# Patient Record
Sex: Male | Born: 1990 | Race: White | Hispanic: No | Marital: Single | Smoking: Current every day smoker
Health system: Southern US, Community
[De-identification: ages and names within clinical notes are randomized; demographics above are authoritative.]

## PROBLEM LIST (undated history)

## (undated) DIAGNOSIS — J45909 Unspecified asthma, uncomplicated: Secondary | ICD-10-CM

## (undated) HISTORY — PX: TYMPANOSTOMY: SHX2586

## (undated) HISTORY — PX: CLEFT PALATE REPAIR: SUR1165

---
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
## 2008-11-21 ENCOUNTER — Emergency Department: Payer: Self-pay | Admitting: Emergency Medicine

## 2009-11-13 ENCOUNTER — Ambulatory Visit: Payer: Self-pay | Admitting: Pediatrics

## 2010-03-11 IMAGING — CR DG FOOT COMPLETE 3+V*L*
1 series · 3 of 3 positions shown · non-contrast
Comparison: none

REASON FOR EXAM: fall/pain
COMMENTS:

PROCEDURE:     DXR - DXR FOOT LT COMP W/OBLIQUES  - November 21, 2008  [DATE]
RESULT:     There does not appear to be evidence of fracture, dislocation or
malalignment.

[Series 1: view not recorded · 0.17mm/px · 3 of 3 slices shown]
[im 1/3]
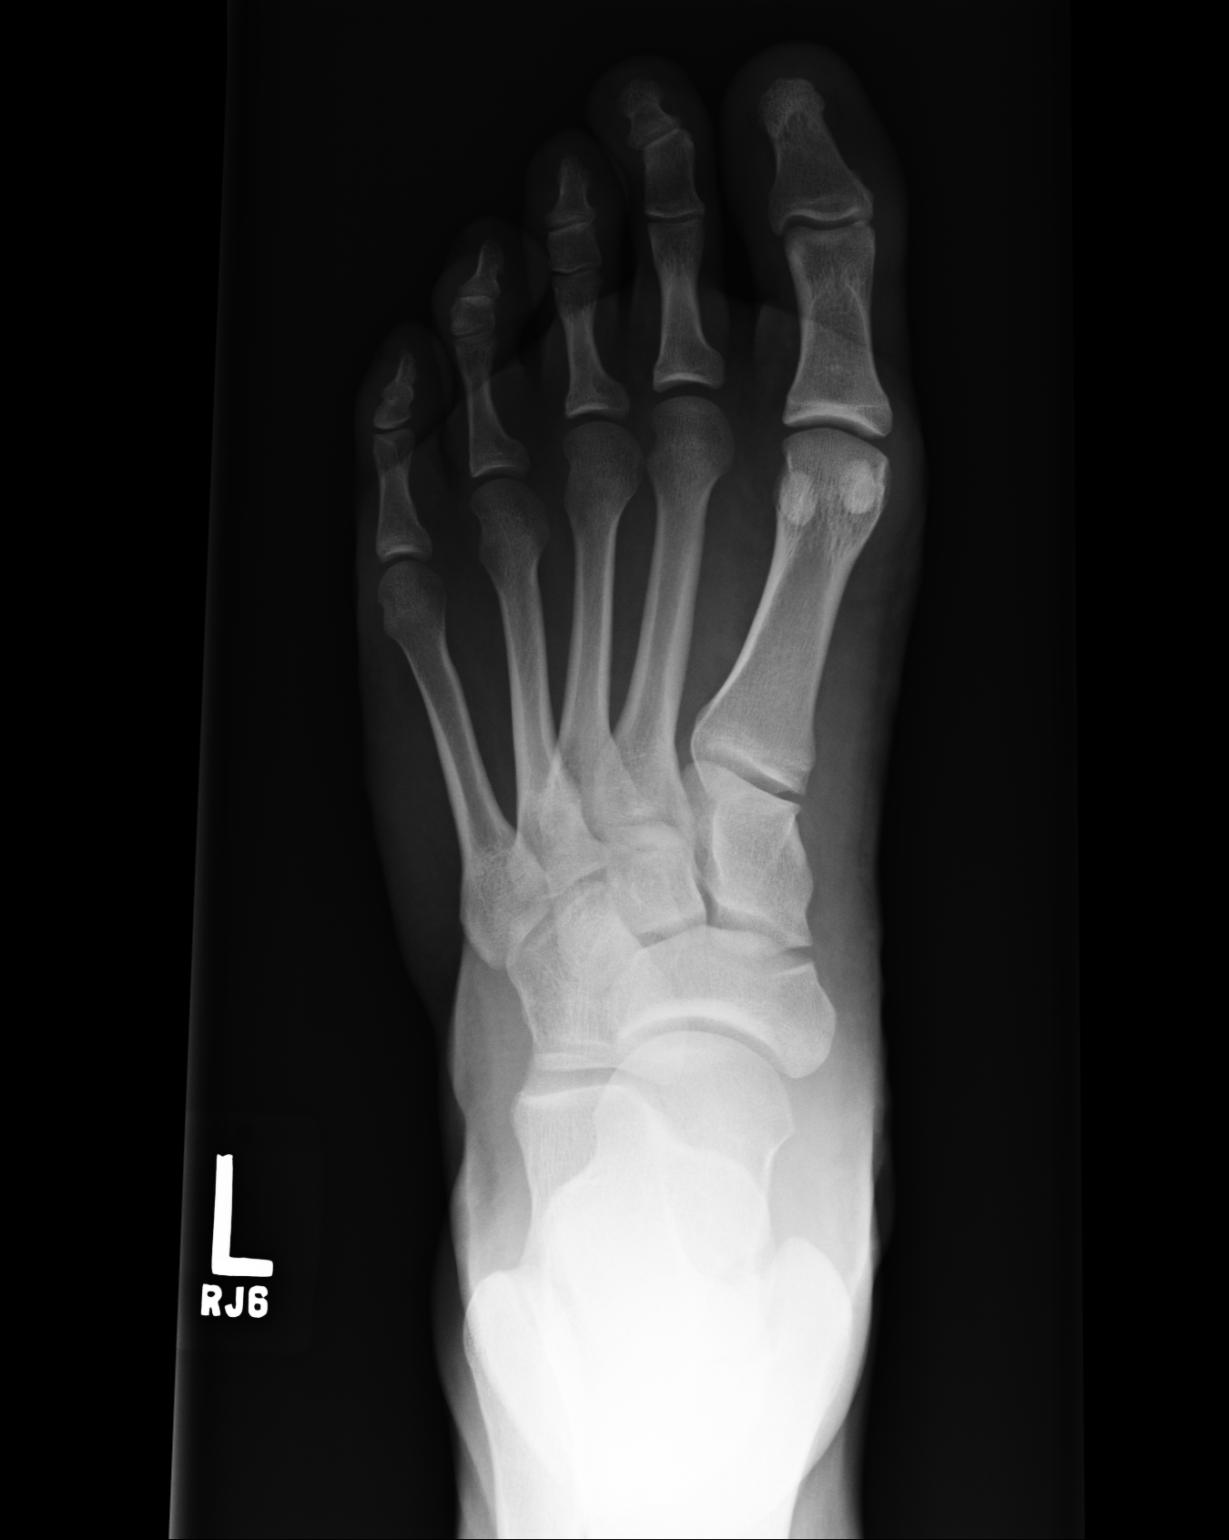
[im 2/3]
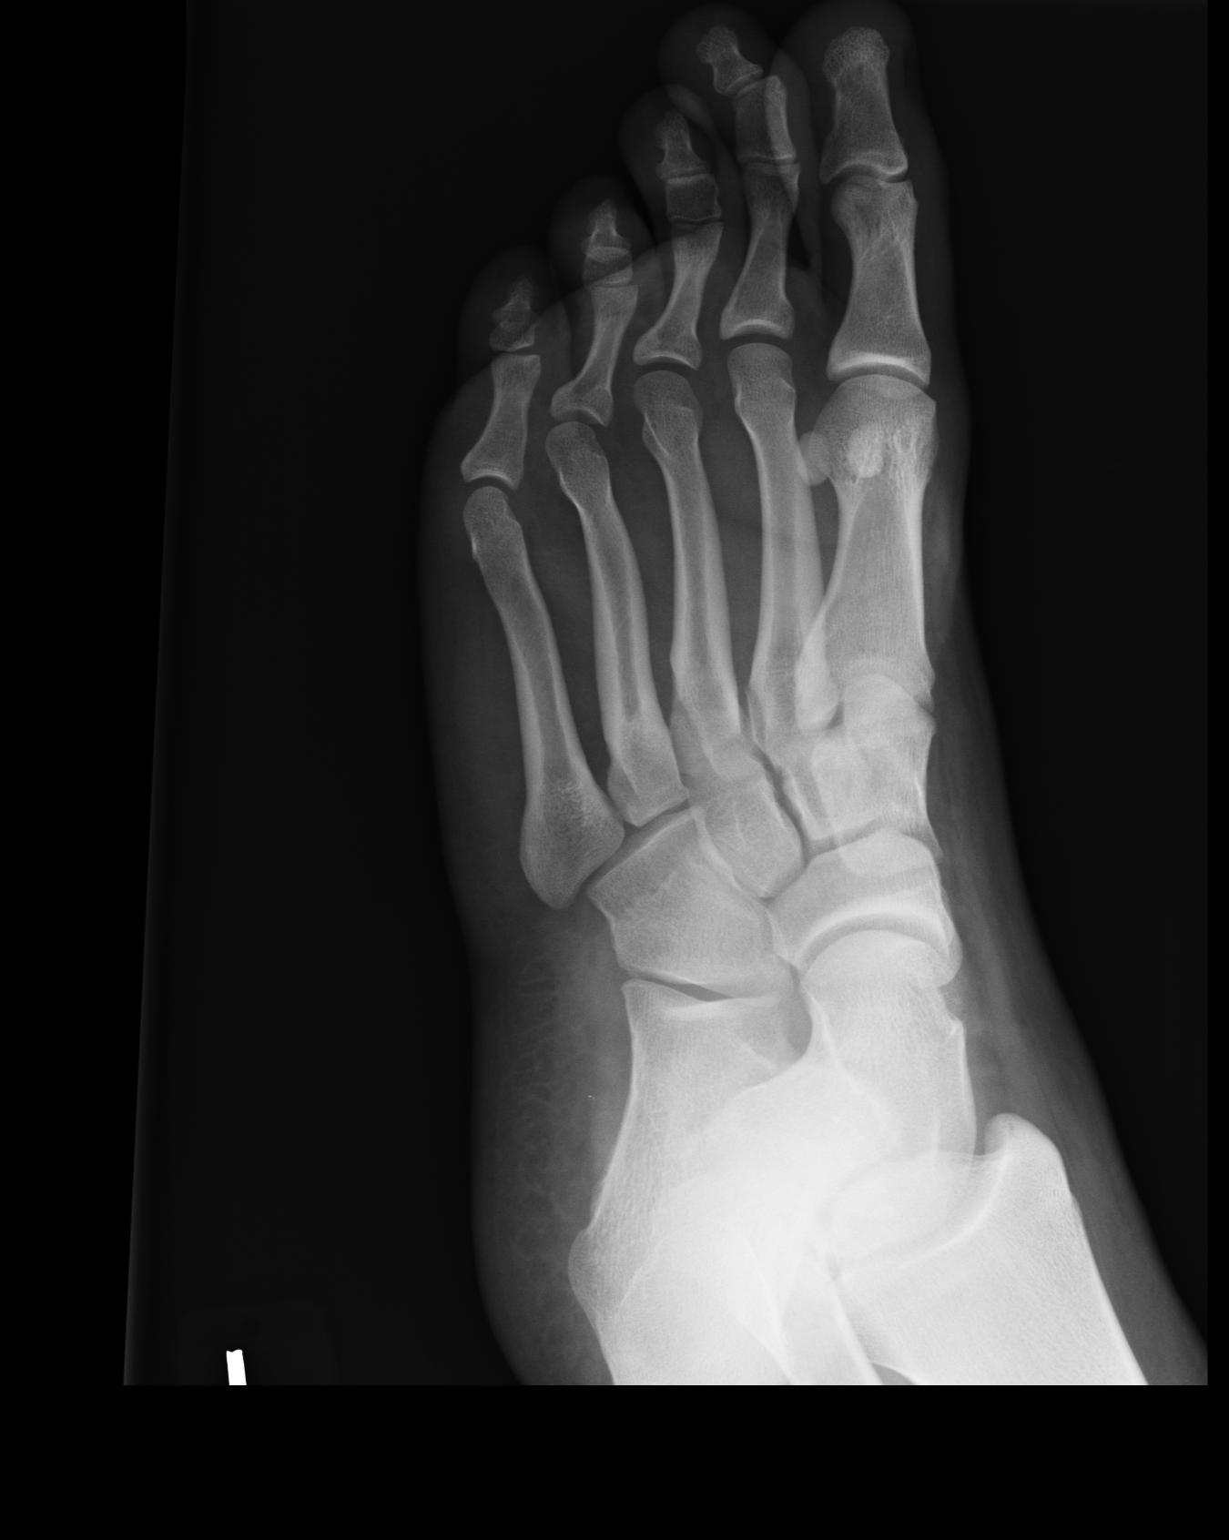
[im 3/3]
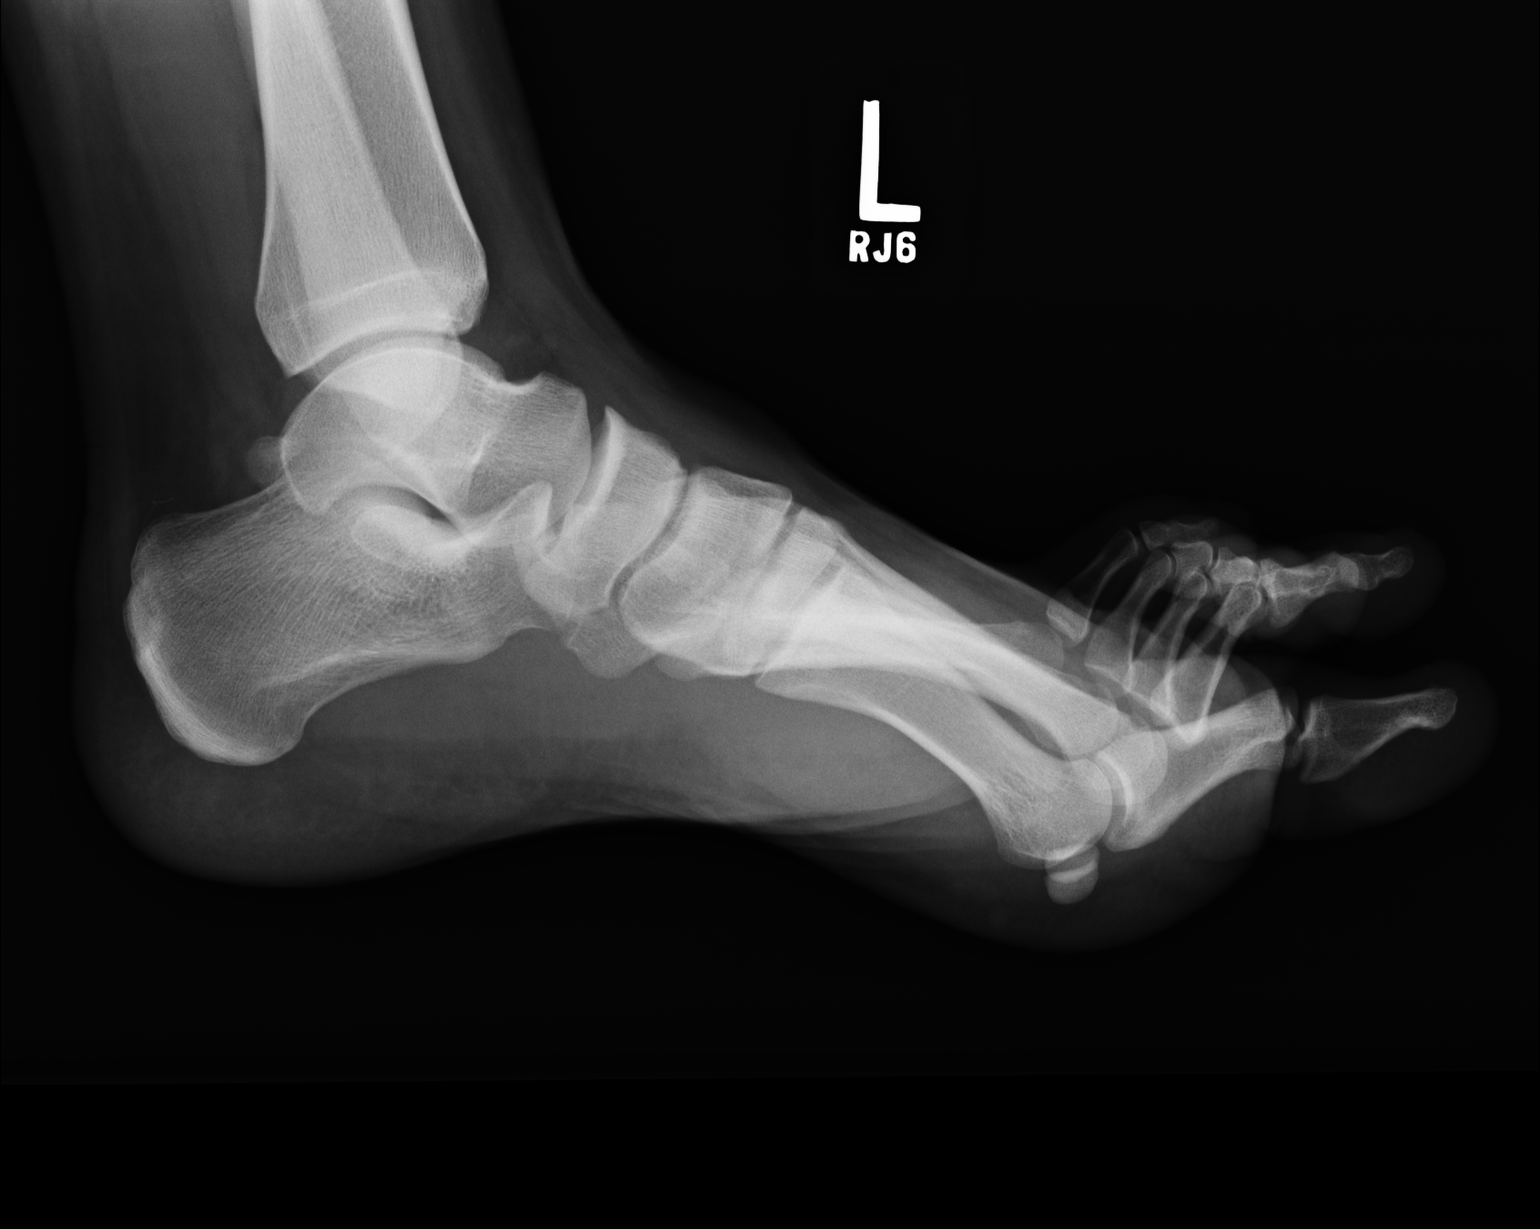

[3 of 3 positions shown; findings below may reference images not displayed]

IMPRESSION: 1.     No acute osseous abnormality.
2.     If there is persistent clinical concern or persistent complaints of
pain, repeat evaluation in 7-10 days is recommended, if clinically
warranted.

## 2011-02-17 ENCOUNTER — Emergency Department: Payer: Self-pay | Admitting: General Practice

## 2013-04-09 ENCOUNTER — Emergency Department: Payer: Self-pay | Admitting: Emergency Medicine

## 2013-10-30 ENCOUNTER — Emergency Department: Payer: Self-pay | Admitting: Emergency Medicine

## 2013-11-07 ENCOUNTER — Emergency Department: Payer: Self-pay | Admitting: Emergency Medicine

## 2018-10-21 ENCOUNTER — Ambulatory Visit: Payer: BLUE CROSS/BLUE SHIELD | Attending: Otolaryngology

## 2018-10-21 DIAGNOSIS — F5101 Primary insomnia: Secondary | ICD-10-CM | POA: Insufficient documentation

## 2018-10-21 DIAGNOSIS — G4733 Obstructive sleep apnea (adult) (pediatric): Secondary | ICD-10-CM | POA: Insufficient documentation

## 2022-09-21 ENCOUNTER — Emergency Department: Payer: BC Managed Care – PPO

## 2022-09-21 ENCOUNTER — Encounter: Payer: Self-pay | Admitting: Emergency Medicine

## 2022-09-21 ENCOUNTER — Other Ambulatory Visit: Payer: Self-pay

## 2022-09-21 ENCOUNTER — Emergency Department
Admission: EM | Admit: 2022-09-21 | Discharge: 2022-09-22 | Disposition: A | Discharge status: Home / Self Care | Payer: BC Managed Care – PPO | Attending: Emergency Medicine | Admitting: Emergency Medicine

## 2022-09-21 DIAGNOSIS — R079 Chest pain, unspecified: Secondary | ICD-10-CM | POA: Diagnosis present

## 2022-09-21 DIAGNOSIS — F17210 Nicotine dependence, cigarettes, uncomplicated: Secondary | ICD-10-CM | POA: Insufficient documentation

## 2022-09-21 DIAGNOSIS — J45909 Unspecified asthma, uncomplicated: Secondary | ICD-10-CM | POA: Insufficient documentation

## 2022-09-21 DIAGNOSIS — J40 Bronchitis, not specified as acute or chronic: Secondary | ICD-10-CM

## 2022-09-21 DIAGNOSIS — J209 Acute bronchitis, unspecified: Secondary | ICD-10-CM | POA: Diagnosis not present

## 2022-09-21 DIAGNOSIS — R0789 Other chest pain: Secondary | ICD-10-CM

## 2022-09-21 HISTORY — DX: Unspecified asthma, uncomplicated: J45.909

## 2022-09-21 LAB — CBC
HCT: 46.3 % (ref 39.0–52.0)
Hemoglobin: 16.1 g/dL (ref 13.0–17.0)
MCH: 31.9 pg (ref 26.0–34.0)
MCHC: 34.8 g/dL (ref 30.0–36.0)
MCV: 91.7 fL (ref 80.0–100.0)
Platelets: 176 10*3/uL (ref 150–400)
RBC: 5.05 MIL/uL (ref 4.22–5.81)
RDW: 12.3 % (ref 11.5–15.5)
WBC: 8.4 10*3/uL (ref 4.0–10.5)
nRBC: 0 % (ref 0.0–0.2)

## 2022-09-21 LAB — BASIC METABOLIC PANEL
Anion gap: 10 (ref 5–15)
BUN: 18 mg/dL (ref 6–20)
CO2: 29 mmol/L (ref 22–32)
Calcium: 9 mg/dL (ref 8.9–10.3)
Chloride: 105 mmol/L (ref 98–111)
Creatinine, Ser: 1.06 mg/dL (ref 0.61–1.24)
GFR, Estimated: >60 mL/min (ref >60)
Glucose, Bld: 137 mg/dL — ABNORMAL HIGH (ref 70–99)
Potassium: 4.2 mmol/L (ref 3.5–5.1)
Sodium: 144 mmol/L (ref 135–145)

## 2022-09-21 LAB — TROPONIN I (HIGH SENSITIVITY): Troponin I (High Sensitivity): 7 ng/L (ref <18)

## 2022-09-21 NOTE — ED Triage Notes (Signed)
Pt to ED via POV with c/o chest tightness and SOB that started this morning. Pt states is a current smoker. Pt states used an old albuterol inhaler without relief. Pt A&O x4, ambulatory without difficulty to triage.

## 2022-09-22 LAB — TROPONIN I (HIGH SENSITIVITY): Troponin I (High Sensitivity): 7 ng/L (ref <18)

## 2022-09-22 MED ORDER — DEXAMETHASONE 10 MG/ML FOR PEDIATRIC ORAL USE
10.0000 mg | Freq: Once | INTRAMUSCULAR | Status: AC
  Administered 2022-09-22: 10 mg via ORAL
  Filled 2022-09-22: qty 1

## 2022-09-22 MED ORDER — ALBUTEROL SULFATE HFA 108 (90 BASE) MCG/ACT IN AERS: INHALATION_SPRAY | RESPIRATORY_TRACT | 0 refills | Status: AC

## 2022-09-22 NOTE — Discharge Instructions (Addendum)
Your workup was reassuring today and it appears that she has some bronchitis.  You likely also are developing some mild COPD or asthma over time, particularly with your smoking history.  We recommend that you use the prescribed albuterol inhaler as needed.  We gave you a one-time dose of a steroid called Decadron which should help with the inflammation associated with the bronchitis.  Please follow-up with your primary care provider and discuss whether you may benefit from pulmonary function tests or referral to a pulmonologist.    Return to the emergency department if you develop new or worsening symptoms that concern you.

## 2022-09-22 NOTE — ED Provider Notes (Signed)
[facility] Provider Note    Event Date/Time   First MD Initiated Contact with Patient 09/21/22 2325     (approximate)   History   Chest Pain   HPI  Lonnie Mason is a 31 y.o. male who presents for evaluation of some tightness in his chest and shortness of breath.  The symptoms started within the last 24 hours.  He states that he has a long-term cigarette smoker and that he was diagnosed with asthma years ago.  He generally is well-controlled and did not even have a albuterol inhaler anymore.  When his symptoms started feels similar to prior episodes of asthma.  He also said that he has many environmental allergies and that seems to be worse when the weather changes dramatically like it has been doing recently.  He said that he was able to "track down" and albuterol inhaler and use it before coming in and that helped him feel a lot better.  Currently he has no symptoms.  He had no pain in his chest, just a feeling of tightness associated with the shortness of breath and wheezing.  He has had a little bit of a headache recently and some nasal congestion.  No nausea or vomiting and no abdominal pain.  No urinary symptoms.     Physical Exam   Triage Vital Signs: ED Triage Vitals  Enc Vitals Group     BP 09/21/22 2207 (!) 187/102     Pulse Rate 09/21/22 2207 (!) 104     Resp 09/21/22 2207 (!) 21     Temp 09/21/22 2207 98.2 F (36.8 C)     Temp Source 09/21/22 2207 Oral     SpO2 09/21/22 2207 94 %     Weight 09/21/22 2204 (!) 138.3 kg (305 lb)     Height 09/21/22 2204 1.772 m (5' 9.75")     Head Circumference --      Peak Flow --      Pain Score 09/21/22 2204 3     Pain Loc --      Pain Edu? --      Excl. in [place]? --     Most recent vital signs: Vitals:   09/21/22 2330 09/22/22 0029  BP: 132/86 (!) 144/88  Pulse: (!) 105 100  Resp: 20 19  Temp: 98 F (36.7 C) 98 F (36.7 C)  SpO2: 91% 94%     General: Awake, no distress.  CV:  Good  peripheral perfusion.  Normal heart sounds.  Borderline tachycardia, regular rhythm. Resp:  Normal effort. Speaking easily and comfortably, no accessory muscle usage nor intercostal retractions.  No wheezing or coarse breath sounds at this time.  Equal breath sounds bilaterally. Abd:  No distention.  Morbid obesity.  No tenderness to palpation of the abdomen. Other:  Patient is conversant, mood and affect are normal, appropriate, and pleasant, patient does not appear ill or toxic.   ED Results / Procedures / Treatments   Labs (all labs ordered are listed, but only abnormal results are displayed) Labs Reviewed  BASIC METABOLIC PANEL - Abnormal; Notable for the following components:      Result Value   Glucose, Bld 137 (*)    All other components within normal limits  CBC  TROPONIN I (HIGH SENSITIVITY)  TROPONIN I (HIGH SENSITIVITY)     EKG  ED ECG REPORT I, Hinda Kehr, the attending physician, personally viewed and interpreted this ECG.  Date: 09/21/2022 EKG Time: 22: 06 Rate:  102 Rhythm: Sinus tachycardia QRS Axis: Right axis deviation Intervals: Incomplete right bundle branch block ST/T Wave abnormalities: Non-specific ST segment / T-wave changes, but no clear evidence of acute ischemia. Narrative Interpretation: no definitive evidence of acute ischemia; does not meet STEMI criteria.    RADIOLOGY I viewed and interpreted the patient's two-view chest x-ray.  There is some thickening of the airways suggestive of viral illness.  Radiology report suggests bronchitis or reactive airway disease.    PROCEDURES:  Critical Care performed: No  Procedures   MEDICATIONS ORDERED IN ED: Medications  dexamethasone (DECADRON) 10 MG/ML injection for Pediatric ORAL use 10 mg (10 mg Oral Given 09/22/22 0021)     IMPRESSION / MDM / ASSESSMENT AND PLAN / ED COURSE  I reviewed the triage vital signs and the nursing notes.                              Differential diagnosis  includes, but is not limited to, asthma exacerbation, COPD, viral illness, bronchitis, pneumonia, less likely ACS or PE.  Patient's presentation is most consistent with acute presentation with potential threat to life or bodily function.  Labs/studies ordered: Two-view chest x-ray, EKG, CBC, high-sensitivity troponin x 2, basic metabolic panel.  Patient is morbidly obese and has a history of asthma and smokes.  He feels better after using his own albuterol.  His workup has been very reassuring and he has signs of bronchitis on chest x-ray.  I gave him a dose of Decadron 10 mg p.o. but I do not think he would benefit from a prescription of prednisone.  I wrote a prescription for an albuterol inhaler and he has a primary care provider with whom he can follow-up.  I gave my usual and customary return precautions.  No indication for hospitalization.      FINAL CLINICAL IMPRESSION(S) / ED DIAGNOSES   Final diagnoses:  Bronchitis  Chest tightness     Rx / DC Orders   ED Discharge Orders          Ordered    albuterol (VENTOLIN HFA) 108 (90 Base) MCG/ACT inhaler       Note to Pharmacy: Pharmacy may substitute brand and size for insurance-approved equivalent   09/22/22 0018             Note:  This document was prepared using Dragon voice recognition software and may include unintentional dictation errors.   Hinda Kehr, MD 09/22/22 [phone]

## 2023-05-25 ENCOUNTER — Emergency Department: Payer: BC Managed Care – PPO

## 2023-05-25 ENCOUNTER — Other Ambulatory Visit: Payer: Self-pay

## 2023-05-25 ENCOUNTER — Emergency Department
Admission: EM | Admit: 2023-05-25 | Discharge: 2023-05-26 | Disposition: A | Discharge status: Home / Self Care | Payer: BC Managed Care – PPO | Attending: Emergency Medicine | Admitting: Emergency Medicine

## 2023-05-25 DIAGNOSIS — N5089 Other specified disorders of the male genital organs: Secondary | ICD-10-CM | POA: Diagnosis present

## 2023-05-25 DIAGNOSIS — N492 Inflammatory disorders of scrotum: Secondary | ICD-10-CM | POA: Diagnosis not present

## 2023-05-25 DIAGNOSIS — J45909 Unspecified asthma, uncomplicated: Secondary | ICD-10-CM | POA: Insufficient documentation

## 2023-05-25 LAB — URINALYSIS, ROUTINE W REFLEX MICROSCOPIC
Bilirubin Urine: NEGATIVE
Glucose, UA: NEGATIVE mg/dL
Hgb urine dipstick: NEGATIVE
Ketones, ur: NEGATIVE mg/dL
Leukocytes,Ua: NEGATIVE
Nitrite: NEGATIVE
Protein, ur: NEGATIVE mg/dL
Specific Gravity, Urine: 1.027 (ref 1.005–1.030)
pH: 5 (ref 5.0–8.0)

## 2023-05-25 NOTE — ED Provider Notes (Signed)
[facility] Provider Note    Event Date/Time   First MD Initiated Contact with Patient 05/25/23 2352     (approximate)   History   Testicle Pain   HPI  Lonnie Mason is a 32 y.o. male with history of pretension, asthma, obesity who presents to the emergency department with complaints of scrotal swelling for the past week.  States he started a new job at [employer] and does a lot of walking and thought that maybe his scrotum was just getting "pinched".  It is not causing him significant pain and there is no redness but he has noticed a hard area to the mid scrotum.  No drainage.  No testicular pain or testicular masses.  No dysuria, penile discharge.  No fever.  He is not a diabetic.   History provided by patient.    Past Medical History:  Diagnosis Date   Asthma     Past Surgical History:  Procedure Laterality Date   CLEFT PALATE REPAIR     TYMPANOSTOMY      MEDICATIONS:  Prior to Admission medications   Medication Sig Start Date End Date Taking? Authorizing Provider  albuterol (VENTOLIN HFA) 108 (90 Base) MCG/ACT inhaler Inhale 2-4 puffs by mouth every 4 hours as needed for wheezing, cough, and/or shortness of breath 09/22/22   Loleta Rose, MD    Physical Exam   Triage Vital Signs: ED Triage Vitals [05/25/23 1935]  Encounter Vitals Group     BP (!) 171/112     Systolic BP Percentile      Diastolic BP Percentile      Pulse Rate (!) 110     Resp 18     Temp 98.4 F (36.9 C)     Temp Source Oral     SpO2 92 %     Weight (!) 305 lb (138.3 kg)     Height 5\' 10"  (1.778 m)     Head Circumference      Peak Flow      Pain Score 3     Pain Loc      Pain Education      Exclude from Growth Chart     Most recent vital signs: Vitals:   05/25/23 1935 05/26/23 0011  BP: (!) 171/112 (!) 168/98  Pulse: (!) 110 100  Resp: 18 16  Temp: 98.4 F (36.9 C) 98.2 F (36.8 C)  SpO2: 92% 100%    CONSTITUTIONAL: Alert, responds appropriately  to questions. Well-appearing; well-nourished, afebrile, nontoxic, in no pain, pleasant HEAD: Normocephalic, atraumatic EYES: Conjunctivae clear, pupils appear equal, sclera nonicteric ENT: normal nose; moist mucous membranes NECK: Supple, normal ROM CARD: RRR; S1 and S2 appreciated RESP: Normal chest excursion without splinting or tachypnea; breath sounds clear and equal bilaterally; no wheezes, no rhonchi, no rales, no hypoxia or respiratory distress, speaking full sentences ABD/GI: Non-distended; soft, non-tender, no rebound, no guarding, no peritoneal signs GU:  Normal external genitalia, circumcised male, normal penile shaft, no blood or discharge at the urethral meatus, no testicular masses or tenderness on exam, patient has some induration to the mid scrotum without redness, fluctuance.  This area is nontender to palpation and there is no crepitus, ecchymosis, gangrene.  No hernias appreciated, 2+ femoral pulses bilaterally; no perineal erythema, warmth, subcutaneous air or crepitus; no high riding testicle. Chaperone present for exam. BACK: The back appears normal EXT: Normal ROM in all joints; no deformity noted, no edema SKIN: Normal color for age and race; warm;  no rash on exposed skin NEURO: Moves all extremities equally, normal speech PSYCH: The patient's mood and manner are appropriate.   ED Results / Procedures / Treatments   LABS: (all labs ordered are listed, but only abnormal results are displayed) Labs Reviewed  URINALYSIS, ROUTINE W REFLEX MICROSCOPIC - Abnormal; Notable for the following components:      Result Value   Color, Urine YELLOW (*)    APPearance CLEAR (*)    All other components within normal limits     EKG:  EKG Interpretation Date/Time:    Ventricular Rate:    PR Interval:    QRS Duration:    QT Interval:    QTC Calculation:   R Axis:      Text Interpretation:           RADIOLOGY: My personal review and interpretation of imaging: Doppler  shows no torsion.  No abscess appreciated.  I have personally reviewed all radiology reports.   US SCROTUM W/DOPPLER  Result Date: 05/25/2023 CLINICAL DATA:  Bilateral testicular pain for 1 week, mostly when walking. EXAM: SCROTAL ULTRASOUND DOPPLER ULTRASOUND OF THE TESTICLES TECHNIQUE: Complete ultrasound examination of the testicles, epididymis, and other scrotal structures was performed. Color and spectral Doppler ultrasound were also utilized to evaluate blood flow to the testicles. COMPARISON:  None Available. FINDINGS: Right testicle Measurements: 4.9 x 2.8 x 3.6 cm. Tiny echogenic foci suggesting microcalcifications scattered throughout the testis. No focal mass lesion identified. Left testicle Measurements: 5.1 x 2.7 x 3.2 cm. Tiny echogenic foci suggesting microcalcifications scattered throughout the testis. No focal mass identified. Right epididymis:  Normal in size and appearance. Left epididymis:  Normal in size and appearance. Hydrocele:  None visualized. Varicocele:  None visualized. Normal homogeneous flow is demonstrated in the testicular parenchyma bilaterally. Pulsed Doppler interrogation of both testes demonstrates normal arterial waveforms bilaterally. A normal venous waveform is demonstrated in the right but the technologist indicates that they were unable to obtained a venous waveform on the left. This could be due to technical factors or could indicate evidence of intermittent torsion. Correlate with physical examination. Consider repeat imaging if symptoms persist. IMPRESSION: 1. Normal color flow Doppler signal demonstrated to both testes and normal arterial waveforms bilaterally. However the technologist was unable to obtain venous waveform on the left. This could be due to technical factors or may indicate evidence of intermittent torsion. Correlate with physical examination and consider repeat imaging. 2. Scattered microcalcifications in both testes. No focal mass lesions. 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 Monthly testicular self-examination and annual physical exams are considered appropriate surveillance. If patient has other risk factors for testicular carcinoma, then referral to Urology should be considered. (Reference: DeCastro, et al.: A 5-Year Follow up Study of Asymptomatic Men with Testicular Microlithiasis. J Urol 2008; 179:1420-1423.) Electronically Signed   By: Burman Nieves M.D.   On: 05/25/2023 23:05     PROCEDURES:  Critical Care performed: No   CRITICAL CARE Performed by: Baxter Hire Katharin Schneider   Total critical care time: 0 minutes  Critical care time was exclusive of separately billable procedures and treating other patients.  Critical care was necessary to treat or prevent imminent or life-threatening deterioration.  Critical care was time spent personally by me on the following activities: development of treatment plan with patient and/or surrogate as well as nursing, discussions  with consultants, evaluation of patient's response to treatment, examination of patient, obtaining history from patient or surrogate, ordering and performing treatments and interventions, ordering and review of laboratory studies, ordering and review of radiographic studies, pulse oximetry and re-evaluation of patient's condition.   Procedures    IMPRESSION / MDM / ASSESSMENT AND PLAN / ED COURSE  I reviewed the triage vital signs and the nursing notes.    Patient here with scrotal swelling.     DIFFERENTIAL DIAGNOSIS (includes but not limited to):   Scrotal swelling possibly secondary to repeated trauma, cellulitis, no obvious signs of abscess, doubt torsion given no testicular pain   Patient's presentation is most consistent with acute presentation with potential threat to  life or bodily function.   PLAN: Urine shows no sign of infection.  Scrotal Doppler reviewed and interpreted by myself and the radiologist and shows no torsion.  Clinically he has no testicular pain or testicular masses.  His scrotum is slightly indurated which could be from repeated trauma from walking and stating that he feels like his scrotum is getting "pinched" regularly but cannot rule out cellulitis.  There is no sign of abscess clinically on exam or on ultrasound.  Low suspicion for Fournier's gangrene.  He has no crepitus.  No systemic symptoms.  He is not a diabetic.  Will start him on clindamycin for both strep and staph coverage and give probiotics.  Recommended follow-up with urology if symptoms not improving.  Discussed at length strict return precautions.  He is comfortable with this plan.   MEDICATIONS GIVEN IN ED: Medications  clindamycin (CLEOCIN) capsule 450 mg (450 mg Oral Given 05/26/23 0013)     ED COURSE:  At this time, I do not feel there is any life-threatening condition present. I reviewed all nursing notes, vitals, pertinent previous records.  All lab and urine results, EKGs, imaging ordered have been independently reviewed and interpreted by myself.  I reviewed all available radiology reports from any imaging ordered this visit.  Based on my assessment, I feel the patient is safe to be discharged home without further emergent workup and can continue workup as an outpatient as needed. Discussed all findings, treatment plan as well as usual and customary return precautions.  They verbalize understanding and are comfortable with this plan.  Outpatient follow-up has been provided as needed.  All questions have been answered.    CONSULTS:  none   OUTSIDE RECORDS REVIEWED: Reviewed last family medicine note on 08/07/2022.       FINAL CLINICAL IMPRESSION(S) / ED DIAGNOSES   Final diagnoses:  Cellulitis of scrotum     Rx / DC Orders   ED Discharge Orders           Ordered    clindamycin (CLEOCIN) 150 MG capsule  3 times daily        05/26/23 0008    Bacillus Coagulans-Inulin (PROBIOTIC) 1-250 BILLION-MG CAPS  Daily        05/26/23 0008             Note:  This document was prepared using Dragon voice recognition software and may include unintentional dictation errors.   Bobbette Eakes, Layla Maw, DO 05/26/23 0020

## 2023-05-25 NOTE — ED Triage Notes (Signed)
Pt presents to ER with c/o pain to his testicles.  Pt states around one week ago, he started to notice a lump in his scrotum.  Pt states this lump is tender to touch and is about the size of a quarter.  Pt states pain is worsened upon palpation.  Pt denies urinary symptoms and is not concerned about possible STDs.  Pt otherwise A&O x4 and in NAD at this time.

## 2023-05-25 NOTE — ED Notes (Signed)
No answer when called several times from lobby 

## 2023-05-26 MED ORDER — CLINDAMYCIN HCL 150 MG PO CAPS: 450.0000 mg | ORAL_CAPSULE | Freq: Three times a day (TID) | ORAL | 0 refills | Status: AC

## 2023-05-26 MED ORDER — PROBIOTIC 1-250 BILLION-MG PO CAPS: 1.0000 | ORAL_CAPSULE | Freq: Every day | ORAL | 0 refills | Status: AC

## 2023-05-26 MED ORDER — SULFAMETHOXAZOLE-TRIMETHOPRIM 800-160 MG PO TABS: 1.0000 | ORAL_TABLET | Freq: Once | ORAL | Status: DC

## 2023-05-26 MED ORDER — CLINDAMYCIN HCL 150 MG PO CAPS
450.0000 mg | ORAL_CAPSULE | Freq: Once | ORAL | Status: AC
  Administered 2023-05-26: 450 mg via ORAL
  Filled 2023-05-26: qty 3

## 2023-05-26 NOTE — Discharge Instructions (Signed)
You may alternate Tylenol 1000 mg every 6 hours as needed for pain, fever and Ibuprofen 800 mg every 6-8 hours as needed for pain, fever.  Please take Ibuprofen with food.  Do not take more than 4000 mg of Tylenol (acetaminophen) in a 24 hour period. ° °

## 2023-08-23 ENCOUNTER — Emergency Department: Payer: BC Managed Care – PPO

## 2023-08-23 ENCOUNTER — Other Ambulatory Visit: Payer: Self-pay

## 2023-08-23 DIAGNOSIS — J45901 Unspecified asthma with (acute) exacerbation: Secondary | ICD-10-CM | POA: Diagnosis not present

## 2023-08-23 DIAGNOSIS — R0602 Shortness of breath: Secondary | ICD-10-CM | POA: Diagnosis present

## 2023-08-23 DIAGNOSIS — F17210 Nicotine dependence, cigarettes, uncomplicated: Secondary | ICD-10-CM | POA: Diagnosis not present

## 2023-08-23 MED ORDER — METHYLPREDNISOLONE SODIUM SUCC 125 MG IJ SOLR
125.0000 mg | Freq: Once | INTRAMUSCULAR | Status: AC
  Administered 2023-08-23: 125 mg via INTRAVENOUS
  Filled 2023-08-23: qty 2

## 2023-08-23 MED ORDER — IPRATROPIUM-ALBUTEROL 0.5-2.5 (3) MG/3ML IN SOLN
3.0000 mL | Freq: Once | RESPIRATORY_TRACT | Status: AC
  Administered 2023-08-23: 3 mL via RESPIRATORY_TRACT
  Filled 2023-08-23: qty 3

## 2023-08-23 NOTE — ED Notes (Signed)
Mckenzie, RN made aware ot pt O2 sat of 92% on RA. Pt placed on 2L O2 via [place] at this time.

## 2023-08-23 NOTE — ED Triage Notes (Signed)
Upper wheeze auscultated over pt throat. Lung sounds clear otherwise. Pt speaking in full sentences.

## 2023-08-23 NOTE — ED Triage Notes (Signed)
BIB [facility] from home. Called out asthma exacerbation. Out of meds at home. Pt alert and oriented on arrival. Pt DOE but unlabored and rest and with symmetric chest rise and fall.  96% RA on EMS arrival. 99% with nebs going. 1 albuterol and 1 duo 190/120 hasn't taken bp meds since last night.

## 2023-08-23 NOTE — ED Provider Notes (Signed)
Emergency department triage note  Patient is a 32 year old male with a stated past medical history of asthma presents via EMS with complaints of shortness of breath.  Patient states that shortness of breath progressed over the last hour and was refractory to home albuterol treatments.  Patient was initially in mild respiratory distress with an expiratory wheezing over bilateral lung fields per EMS personnel the patient was given a DuoNeb en route with improvement in patient's symptoms as well as physical exam per EMS personnel.   Brief physical exam:  General: Middle-aged obese Caucasian male sitting in stretcher resting comfortably with nonrebreather in place and nebulizer treatment going Cardiopulmonary: No cyanosis.  Normal respiratory effort.  Clear to auscultation bilaterally  I have ordered customary labs and imaging including CBC, CMP, troponin, EKG, and chest x-ray as well as empiric treatments for asthma exacerbation such as DuoNeb, Solu-Medrol, and continued monitoring.   Merwyn Katos, MD 08/23/23 [phone]

## 2023-08-24 ENCOUNTER — Emergency Department
Admission: EM | Admit: 2023-08-24 | Discharge: 2023-08-24 | Disposition: A | Discharge status: Home / Self Care | Payer: BC Managed Care – PPO | Attending: Emergency Medicine | Admitting: Emergency Medicine

## 2023-08-24 DIAGNOSIS — J45901 Unspecified asthma with (acute) exacerbation: Secondary | ICD-10-CM

## 2023-08-24 LAB — CBC WITH DIFFERENTIAL/PLATELET
Abs Immature Granulocytes: 0.05 10*3/uL (ref 0.00–0.07)
Basophils Absolute: 0.1 10*3/uL (ref 0.0–0.1)
Basophils Relative: 1 %
Eosinophils Absolute: 0.3 10*3/uL (ref 0.0–0.5)
Eosinophils Relative: 4 %
HCT: 44.4 % (ref 39.0–52.0)
Hemoglobin: 15.9 g/dL (ref 13.0–17.0)
Immature Granulocytes: 1 %
Lymphocytes Relative: 30 %
Lymphs Abs: 2.9 10*3/uL (ref 0.7–4.0)
MCH: 31.9 pg (ref 26.0–34.0)
MCHC: 35.8 g/dL (ref 30.0–36.0)
MCV: 89.2 fL (ref 80.0–100.0)
Monocytes Absolute: 0.6 10*3/uL (ref 0.1–1.0)
Monocytes Relative: 6 %
Neutro Abs: 5.8 10*3/uL (ref 1.7–7.7)
Neutrophils Relative %: 58 %
Platelets: 186 10*3/uL (ref 150–400)
RBC: 4.98 MIL/uL (ref 4.22–5.81)
RDW: 11.9 % (ref 11.5–15.5)
WBC: 9.7 10*3/uL (ref 4.0–10.5)
nRBC: 0 % (ref 0.0–0.2)

## 2023-08-24 LAB — TROPONIN I (HIGH SENSITIVITY)
Troponin I (High Sensitivity): 8 ng/L (ref <18)
Troponin I (High Sensitivity): 8 ng/L (ref <18)

## 2023-08-24 LAB — COMPREHENSIVE METABOLIC PANEL
ALT: 45 U/L — ABNORMAL HIGH (ref 0–44)
AST: 33 U/L (ref 15–41)
Albumin: 4 g/dL (ref 3.5–5.0)
Alkaline Phosphatase: 73 U/L (ref 38–126)
Anion gap: 10 (ref 5–15)
BUN: 12 mg/dL (ref 6–20)
CO2: 30 mmol/L (ref 22–32)
Calcium: 9 mg/dL (ref 8.9–10.3)
Chloride: 100 mmol/L (ref 98–111)
Creatinine, Ser: 0.99 mg/dL (ref 0.61–1.24)
GFR, Estimated: >60 mL/min (ref >60)
Glucose, Bld: 147 mg/dL — ABNORMAL HIGH (ref 70–99)
Potassium: 3.7 mmol/L (ref 3.5–5.1)
Sodium: 140 mmol/L (ref 135–145)
Total Bilirubin: 0.6 mg/dL (ref <1.2)
Total Protein: 7 g/dL (ref 6.5–8.1)

## 2023-08-24 MED ORDER — IPRATROPIUM-ALBUTEROL 0.5-2.5 (3) MG/3ML IN SOLN
3.0000 mL | Freq: Once | RESPIRATORY_TRACT | Status: AC
  Administered 2023-08-24: 3 mL via RESPIRATORY_TRACT
  Filled 2023-08-24: qty 3

## 2023-08-24 MED ORDER — METHYLPREDNISOLONE SODIUM SUCC 125 MG IJ SOLR
125.0000 mg | Freq: Once | INTRAMUSCULAR | Status: AC
  Administered 2023-08-24: 125 mg via INTRAVENOUS
  Filled 2023-08-24: qty 2

## 2023-08-24 MED ORDER — ALBUTEROL SULFATE HFA 108 (90 BASE) MCG/ACT IN AERS: 2.0000 | INHALATION_SPRAY | Freq: Four times a day (QID) | RESPIRATORY_TRACT | 2 refills | Status: AC | PRN

## 2023-08-24 MED ORDER — PREDNISONE 50 MG PO TABS: 50.0000 mg | ORAL_TABLET | Freq: Every day | ORAL | 0 refills | Status: AC

## 2023-08-24 MED ORDER — ALBUTEROL SULFATE (2.5 MG/3ML) 0.083% IN NEBU
2.5000 mg | INHALATION_SOLUTION | Freq: Once | RESPIRATORY_TRACT | Status: AC
  Administered 2023-08-24: 2.5 mg via RESPIRATORY_TRACT
  Filled 2023-08-24: qty 3

## 2023-08-24 NOTE — ED Notes (Signed)
Ambulatory opulse ox is 90-92 on RA.  His pulse went to 118 during walking.

## 2023-08-24 NOTE — ED Notes (Signed)
Pulse ox is maintaining at around 90-91% RA after neb.  His pulse is is now 107.  No distress.

## 2023-08-24 NOTE — ED Provider Notes (Signed)
[facility] Provider Note    Event Date/Time   First MD Initiated Contact with Patient 08/24/23 [phone]     (approximate)   History   Shortness of Breath   HPI  Lonnie Mason is a 32 y.o. male brought in by EMS with a history of asthma presents with complaints of shortness of breath, wheezing, he does smoke cigarettes.     Physical Exam   Triage Vital Signs: ED Triage Vitals  Encounter Vitals Group     BP 08/23/23 2332 (!) 153/88     Systolic BP Percentile --      Diastolic BP Percentile --      Pulse Rate 08/23/23 2332 (!) 104     Resp 08/23/23 2332 (!) 22     Temp 08/23/23 2332 98.2 F (36.8 C)     Temp Source 08/23/23 2332 Oral     SpO2 08/23/23 2332 97 %     Weight 08/23/23 2320 (!) 140.6 kg (310 lb)     Height 08/23/23 2320 1.753 m (5\' 9" )     Head Circumference --      Peak Flow --      Pain Score 08/23/23 2320 0     Pain Loc --      Pain Education --      Exclude from Growth Chart --     Most recent vital signs: Vitals:   08/24/23 0931 08/24/23 1221  BP: (!) 153/100   Pulse: (!) 115 (!) 115  Resp: 20 16  Temp: 97.8 F (36.6 C)   SpO2: 95%      General: Awake, no distress.  CV:  Good peripheral perfusion.  Resp:  Mild tachypnea, scattered wheezes Abd:  No distention.  Other:  No lower extremity swelling   ED Results / Procedures / Treatments   Labs (all labs ordered are listed, but only abnormal results are displayed) Labs Reviewed  COMPREHENSIVE METABOLIC PANEL - Abnormal; Notable for the following components:      Result Value   Glucose, Bld 147 (*)    ALT 45 (*)    All other components within normal limits  CBC WITH DIFFERENTIAL/PLATELET  TROPONIN I (HIGH SENSITIVITY)  TROPONIN I (HIGH SENSITIVITY)     EKG  ED ECG REPORT I, Jene Every, the attending physician, personally viewed and interpreted this ECG.  Date: 08/24/2023  Rhythm: normal sinus rhythm QRS Axis: normal Intervals: normal ST/T  Wave abnormalities: normal Narrative Interpretation: no evidence of acute ischemia    RADIOLOGY X-ray viewed interpret by me, no pneumonia    PROCEDURES:  Critical Care performed:   Procedures   MEDICATIONS ORDERED IN ED: Medications  ipratropium-albuterol (DUONEB) 0.5-2.5 (3) MG/3ML nebulizer solution 3 mL (3 mLs Nebulization Given 08/23/23 2331)  methylPREDNISolone sodium succinate (SOLU-MEDROL) 125 mg/2 mL injection 125 mg (125 mg Intravenous Given 08/23/23 2331)  ipratropium-albuterol (DUONEB) 0.5-2.5 (3) MG/3ML nebulizer solution 3 mL (3 mLs Nebulization Given 08/24/23 0937)  ipratropium-albuterol (DUONEB) 0.5-2.5 (3) MG/3ML nebulizer solution 3 mL (3 mLs Nebulization Given 08/24/23 0936)  methylPREDNISolone sodium succinate (SOLU-MEDROL) 125 mg/2 mL injection 125 mg (125 mg Intravenous Given 08/24/23 1101)  albuterol (PROVENTIL) (2.5 MG/3ML) 0.083% nebulizer solution 2.5 mg (2.5 mg Nebulization Given 08/24/23 1101)     IMPRESSION / MDM / ASSESSMENT AND PLAN / ED COURSE  I reviewed the triage vital signs and the nursing notes. Patient's presentation is most consistent with acute presentation with potential threat to life or bodily function.  Patient  presents with shortness of breath as detailed above, found to have room air oxygen saturations of 90%, started on nasal cannula and treated with Solu-Medrol, DuoNeb with some improvement.  Lab work chest x-ray reassuring, additional DuoNebs ordered  Second dose of Solu-Medrol given  Patient feeling improved, ambulatory pulse ox much better, keeping saturations above 92% with only mild tachycardia.  Patient feels well and would like to be discharged, he knows that he can return anytime if any worsening.  Will prescribe prednisone, albuterol inhaler every 4-6 hours 2 puffs and very strict return precautions        FINAL CLINICAL IMPRESSION(S) / ED DIAGNOSES   Final diagnoses:  Exacerbation of asthma, unspecified asthma  severity, unspecified whether persistent     Rx / DC Orders   ED Discharge Orders          Ordered    predniSONE (DELTASONE) 50 MG tablet  Daily with breakfast        08/24/23 1221    albuterol (VENTOLIN HFA) 108 (90 Base) MCG/ACT inhaler  Every 6 hours PRN        08/24/23 1221             Note:  This document was prepared using Dragon voice recognition software and may include unintentional dictation errors.   Jene Every, MD 08/24/23 [phone]
